# Patient Record
Sex: Male | Born: 1985 | Race: White | Hispanic: No | Marital: Single | Smoking: Never smoker
Health system: Southern US, Community
[De-identification: ages and names within clinical notes are randomized; demographics above are authoritative.]

---

## 2021-04-07 ENCOUNTER — Encounter: Payer: Self-pay | Admitting: Intensive Care

## 2021-04-07 ENCOUNTER — Inpatient Hospital Stay
Admission: EM | Admit: 2021-04-07 | Discharge: 2021-04-10 | DRG: 329 | Disposition: A | Discharge status: Home / Self Care | Payer: Self-pay | Attending: General Surgery | Admitting: General Surgery

## 2021-04-07 ENCOUNTER — Inpatient Hospital Stay: Payer: Self-pay | Admitting: Registered Nurse

## 2021-04-07 ENCOUNTER — Encounter
Admission: EM | Disposition: A | Discharge status: Home / Self Care | Payer: Self-pay | Source: Home / Self Care | Attending: General Surgery

## 2021-04-07 ENCOUNTER — Emergency Department: Payer: Self-pay

## 2021-04-07 ENCOUNTER — Other Ambulatory Visit: Payer: Self-pay

## 2021-04-07 DIAGNOSIS — K450 Other specified abdominal hernia with obstruction, without gangrene: Secondary | ICD-10-CM | POA: Diagnosis present

## 2021-04-07 DIAGNOSIS — K660 Peritoneal adhesions (postprocedural) (postinfection): Secondary | ICD-10-CM | POA: Diagnosis present

## 2021-04-07 DIAGNOSIS — K650 Generalized (acute) peritonitis: Secondary | ICD-10-CM | POA: Diagnosis present

## 2021-04-07 DIAGNOSIS — K55019 Acute (reversible) ischemia of small intestine, extent unspecified: Principal | ICD-10-CM | POA: Diagnosis present

## 2021-04-07 DIAGNOSIS — R1013 Epigastric pain: Secondary | ICD-10-CM

## 2021-04-07 DIAGNOSIS — K56609 Unspecified intestinal obstruction, unspecified as to partial versus complete obstruction: Secondary | ICD-10-CM

## 2021-04-07 DIAGNOSIS — K559 Vascular disorder of intestine, unspecified: Secondary | ICD-10-CM | POA: Diagnosis present

## 2021-04-07 DIAGNOSIS — Z20822 Contact with and (suspected) exposure to covid-19: Secondary | ICD-10-CM | POA: Diagnosis present

## 2021-04-07 HISTORY — PX: LAPAROTOMY: SHX154

## 2021-04-07 HISTORY — PX: BOWEL RESECTION: SHX1257

## 2021-04-07 LAB — COMPREHENSIVE METABOLIC PANEL
ALT: 25 U/L (ref 0–44)
AST: 33 U/L (ref 15–41)
Albumin: 5.8 g/dL — ABNORMAL HIGH (ref 3.5–5.0)
Alkaline Phosphatase: 54 U/L (ref 38–126)
Anion gap: 12 (ref 5–15)
BUN: 18 mg/dL (ref 6–20)
CO2: 20 mmol/L — ABNORMAL LOW (ref 22–32)
Calcium: 10.1 mg/dL (ref 8.9–10.3)
Chloride: 103 mmol/L (ref 98–111)
Creatinine, Ser: 1.01 mg/dL (ref 0.61–1.24)
GFR, Estimated: >60 mL/min (ref >60)
Glucose, Bld: 168 mg/dL — ABNORMAL HIGH (ref 70–99)
Potassium: 4.5 mmol/L (ref 3.5–5.1)
Sodium: 135 mmol/L (ref 135–145)
Total Bilirubin: 1 mg/dL (ref 0.3–1.2)
Total Protein: 8.7 g/dL — ABNORMAL HIGH (ref 6.5–8.1)

## 2021-04-07 LAB — CBC WITH DIFFERENTIAL/PLATELET
Abs Immature Granulocytes: 0.25 10*3/uL — ABNORMAL HIGH (ref 0.00–0.07)
Basophils Absolute: 0.1 10*3/uL (ref 0.0–0.1)
Basophils Relative: 0 %
Eosinophils Absolute: 0 10*3/uL (ref 0.0–0.5)
Eosinophils Relative: 0 %
HCT: 47.6 % (ref 39.0–52.0)
Hemoglobin: 17.4 g/dL — ABNORMAL HIGH (ref 13.0–17.0)
Immature Granulocytes: 1 %
Lymphocytes Relative: 4 %
Lymphs Abs: 1.3 10*3/uL (ref 0.7–4.0)
MCH: 31.8 pg (ref 26.0–34.0)
MCHC: 36.6 g/dL — ABNORMAL HIGH (ref 30.0–36.0)
MCV: 87 fL (ref 80.0–100.0)
Monocytes Absolute: 0.9 10*3/uL (ref 0.1–1.0)
Monocytes Relative: 3 %
Neutro Abs: 26.8 10*3/uL — ABNORMAL HIGH (ref 1.7–7.7)
Neutrophils Relative %: 92 %
Platelets: 288 10*3/uL (ref 150–400)
RBC: 5.47 MIL/uL (ref 4.22–5.81)
RDW: 11.9 % (ref 11.5–15.5)
WBC: 29.3 10*3/uL — ABNORMAL HIGH (ref 4.0–10.5)
nRBC: 0 % (ref 0.0–0.2)

## 2021-04-07 LAB — LIPASE, BLOOD: Lipase: 29 U/L (ref 11–51)

## 2021-04-07 LAB — RESP PANEL BY RT-PCR (FLU A&B, COVID) ARPGX2
Influenza A by PCR: NEGATIVE
Influenza B by PCR: NEGATIVE
SARS Coronavirus 2 by RT PCR: NEGATIVE

## 2021-04-07 LAB — TROPONIN I (HIGH SENSITIVITY): Troponin I (High Sensitivity): <2 ng/L (ref <18)

## 2021-04-07 LAB — ABO/RH: ABO/RH(D): A POS

## 2021-04-07 LAB — CBC
HCT: 47.5 % (ref 39.0–52.0)
Hemoglobin: 17.5 g/dL — ABNORMAL HIGH (ref 13.0–17.0)
MCH: 32.1 pg (ref 26.0–34.0)
MCHC: 36.8 g/dL — ABNORMAL HIGH (ref 30.0–36.0)
MCV: 87 fL (ref 80.0–100.0)
Platelets: 283 10*3/uL (ref 150–400)
RBC: 5.46 MIL/uL (ref 4.22–5.81)
RDW: 11.9 % (ref 11.5–15.5)
WBC: 29.4 10*3/uL — ABNORMAL HIGH (ref 4.0–10.5)
nRBC: 0 % (ref 0.0–0.2)

## 2021-04-07 LAB — TYPE AND SCREEN
ABO/RH(D): A POS
Antibody Screen: NEGATIVE

## 2021-04-07 LAB — LACTIC ACID, PLASMA: Lactic Acid, Venous: 2.9 mmol/L (ref 0.5–1.9)

## 2021-04-07 SURGERY — LAPAROTOMY, EXPLORATORY
Anesthesia: General

## 2021-04-07 MED ORDER — PROMETHAZINE HCL 25 MG/ML IJ SOLN: 6.2500 mg | INTRAMUSCULAR | Status: DC | PRN

## 2021-04-07 MED ORDER — HYDROCODONE-ACETAMINOPHEN 5-325 MG PO TABS
1.0000 | ORAL_TABLET | ORAL | Status: DC | PRN
  Administered 2021-04-07 – 2021-04-10 (×4): 2 via ORAL
  Filled 2021-04-07 (×5): qty 2

## 2021-04-07 MED ORDER — DEXMEDETOMIDINE HCL IN NACL 200 MCG/50ML IV SOLN
INTRAVENOUS | Status: DC | PRN
  Administered 2021-04-07: 12 ug via INTRAVENOUS

## 2021-04-07 MED ORDER — FENTANYL CITRATE (PF) 100 MCG/2ML IJ SOLN
INTRAMUSCULAR | Status: AC
  Filled 2021-04-07: qty 2

## 2021-04-07 MED ORDER — ROCURONIUM BROMIDE 100 MG/10ML IV SOLN
INTRAVENOUS | Status: DC | PRN
  Administered 2021-04-07: 10 mg via INTRAVENOUS
  Administered 2021-04-07 (×2): 20 mg via INTRAVENOUS
  Administered 2021-04-07: 50 mg via INTRAVENOUS

## 2021-04-07 MED ORDER — SODIUM CHLORIDE 0.9 % IV SOLN: INTRAVENOUS | Status: DC

## 2021-04-07 MED ORDER — MIDAZOLAM HCL 2 MG/2ML IJ SOLN
INTRAMUSCULAR | Status: DC | PRN
  Administered 2021-04-07: 2 mg via INTRAVENOUS

## 2021-04-07 MED ORDER — DEXAMETHASONE SODIUM PHOSPHATE 10 MG/ML IJ SOLN
INTRAMUSCULAR | Status: AC
  Filled 2021-04-07: qty 1

## 2021-04-07 MED ORDER — PROPOFOL 10 MG/ML IV BOLUS
INTRAVENOUS | Status: AC
  Filled 2021-04-07: qty 20

## 2021-04-07 MED ORDER — ENOXAPARIN SODIUM 40 MG/0.4ML IJ SOSY
40.0000 mg | PREFILLED_SYRINGE | INTRAMUSCULAR | Status: DC
  Administered 2021-04-07 – 2021-04-09 (×3): 40 mg via SUBCUTANEOUS
  Filled 2021-04-07 (×3): qty 0.4

## 2021-04-07 MED ORDER — ONDANSETRON HCL 4 MG/2ML IJ SOLN
INTRAMUSCULAR | Status: AC
  Filled 2021-04-07: qty 2

## 2021-04-07 MED ORDER — ONDANSETRON HCL 4 MG/2ML IJ SOLN: 4.0000 mg | Freq: Four times a day (QID) | INTRAMUSCULAR | Status: DC | PRN

## 2021-04-07 MED ORDER — LACTATED RINGERS IV BOLUS
1000.0000 mL | Freq: Once | INTRAVENOUS | Status: AC
  Administered 2021-04-07: 1000 mL via INTRAVENOUS

## 2021-04-07 MED ORDER — BUPIVACAINE LIPOSOME 1.3 % IJ SUSP
INTRAMUSCULAR | Status: DC | PRN
  Administered 2021-04-07: 20 mL

## 2021-04-07 MED ORDER — LACTATED RINGERS IV SOLN: INTRAVENOUS | Status: DC | PRN

## 2021-04-07 MED ORDER — MIDAZOLAM HCL 2 MG/2ML IJ SOLN
INTRAMUSCULAR | Status: AC
  Filled 2021-04-07: qty 2

## 2021-04-07 MED ORDER — FENTANYL CITRATE (PF) 100 MCG/2ML IJ SOLN
50.0000 ug | INTRAMUSCULAR | Status: AC | PRN
  Administered 2021-04-07 (×2): 50 ug via INTRAVENOUS
  Filled 2021-04-07 (×2): qty 2

## 2021-04-07 MED ORDER — HYDROMORPHONE HCL 1 MG/ML IJ SOLN
0.5000 mg | INTRAMUSCULAR | Status: DC | PRN
  Administered 2021-04-07: 0.5 mg via INTRAVENOUS
  Filled 2021-04-07: qty 1

## 2021-04-07 MED ORDER — PIPERACILLIN-TAZOBACTAM 3.375 G IVPB 30 MIN: 3.3750 g | Freq: Once | INTRAVENOUS | Status: DC

## 2021-04-07 MED ORDER — PIPERACILLIN-TAZOBACTAM 3.375 G IVPB
INTRAVENOUS | Status: AC
  Filled 2021-04-07: qty 50

## 2021-04-07 MED ORDER — LIDOCAINE HCL (PF) 2 % IJ SOLN
INTRAMUSCULAR | Status: AC
  Filled 2021-04-07: qty 5

## 2021-04-07 MED ORDER — DEXAMETHASONE SODIUM PHOSPHATE 10 MG/ML IJ SOLN
INTRAMUSCULAR | Status: DC | PRN
  Administered 2021-04-07: 10 mg via INTRAVENOUS

## 2021-04-07 MED ORDER — PANTOPRAZOLE SODIUM 40 MG IV SOLR
40.0000 mg | Freq: Every day | INTRAVENOUS | Status: DC
  Administered 2021-04-07 – 2021-04-09 (×3): 40 mg via INTRAVENOUS
  Filled 2021-04-07 (×3): qty 40

## 2021-04-07 MED ORDER — ONDANSETRON HCL 4 MG/2ML IJ SOLN
4.0000 mg | Freq: Once | INTRAMUSCULAR | Status: AC | PRN
  Administered 2021-04-07: 4 mg via INTRAVENOUS
  Filled 2021-04-07: qty 2

## 2021-04-07 MED ORDER — PIPERACILLIN-TAZOBACTAM 3.375 G IVPB
3.3750 g | Freq: Three times a day (TID) | INTRAVENOUS | Status: DC
  Administered 2021-04-07 – 2021-04-10 (×8): 3.375 g via INTRAVENOUS
  Filled 2021-04-07 (×8): qty 50

## 2021-04-07 MED ORDER — ONDANSETRON 4 MG PO TBDP: 4.0000 mg | ORAL_TABLET | Freq: Four times a day (QID) | ORAL | Status: DC | PRN

## 2021-04-07 MED ORDER — DEXMEDETOMIDINE HCL IN NACL 200 MCG/50ML IV SOLN
INTRAVENOUS | Status: AC
  Filled 2021-04-07: qty 50

## 2021-04-07 MED ORDER — ROCURONIUM BROMIDE 10 MG/ML (PF) SYRINGE
PREFILLED_SYRINGE | INTRAVENOUS | Status: AC
  Filled 2021-04-07: qty 10

## 2021-04-07 MED ORDER — KETAMINE HCL 50 MG/ML IJ SOLN
INTRAMUSCULAR | Status: DC | PRN
  Administered 2021-04-07: 50 mg via INTRAMUSCULAR

## 2021-04-07 MED ORDER — FENTANYL CITRATE (PF) 100 MCG/2ML IJ SOLN
25.0000 ug | INTRAMUSCULAR | Status: DC | PRN
  Administered 2021-04-07: 50 ug via INTRAVENOUS
  Administered 2021-04-07 (×2): 25 ug via INTRAVENOUS

## 2021-04-07 MED ORDER — IOHEXOL 350 MG/ML SOLN
100.0000 mL | Freq: Once | INTRAVENOUS | Status: AC | PRN
  Administered 2021-04-07: 100 mL via INTRAVENOUS

## 2021-04-07 MED ORDER — SUGAMMADEX SODIUM 200 MG/2ML IV SOLN
INTRAVENOUS | Status: DC | PRN
  Administered 2021-04-07: 200 mg via INTRAVENOUS

## 2021-04-07 MED ORDER — PROPOFOL 10 MG/ML IV BOLUS
INTRAVENOUS | Status: DC | PRN
  Administered 2021-04-07: 130 mg via INTRAVENOUS

## 2021-04-07 MED ORDER — EPHEDRINE 5 MG/ML INJ
INTRAVENOUS | Status: AC
  Filled 2021-04-07: qty 5

## 2021-04-07 MED ORDER — PHENYLEPHRINE HCL (PRESSORS) 10 MG/ML IV SOLN
INTRAVENOUS | Status: DC | PRN
  Administered 2021-04-07 (×2): 100 ug via INTRAVENOUS

## 2021-04-07 MED ORDER — HYDROMORPHONE HCL 1 MG/ML IJ SOLN
1.0000 mg | INTRAMUSCULAR | Status: DC | PRN
  Administered 2021-04-07: 1 mg via INTRAVENOUS
  Filled 2021-04-07: qty 1

## 2021-04-07 MED ORDER — FENTANYL CITRATE (PF) 100 MCG/2ML IJ SOLN
INTRAMUSCULAR | Status: DC | PRN
  Administered 2021-04-07: 50 ug via INTRAVENOUS
  Administered 2021-04-07: 100 ug via INTRAVENOUS

## 2021-04-07 MED ORDER — BUPIVACAINE-EPINEPHRINE (PF) 0.5% -1:200000 IJ SOLN
INTRAMUSCULAR | Status: DC | PRN
  Administered 2021-04-07: 30 mL via PERINEURAL

## 2021-04-07 MED ORDER — ACETAMINOPHEN 650 MG RE SUPP: 650.0000 mg | Freq: Four times a day (QID) | RECTAL | Status: DC | PRN

## 2021-04-07 MED ORDER — HYDROMORPHONE HCL 1 MG/ML IJ SOLN
INTRAMUSCULAR | Status: AC
  Filled 2021-04-07: qty 1

## 2021-04-07 MED ORDER — ACETAMINOPHEN 10 MG/ML IV SOLN
INTRAVENOUS | Status: AC
  Filled 2021-04-07: qty 100

## 2021-04-07 MED ORDER — MORPHINE SULFATE (PF) 4 MG/ML IV SOLN
4.0000 mg | INTRAVENOUS | Status: DC | PRN
Start: 2021-04-07 — End: 2021-04-10
  Administered 2021-04-07 – 2021-04-08 (×5): 4 mg via INTRAVENOUS
  Filled 2021-04-07 (×5): qty 1

## 2021-04-07 MED ORDER — HYDROMORPHONE HCL 1 MG/ML IJ SOLN
0.5000 mg | INTRAMUSCULAR | Status: AC | PRN
  Administered 2021-04-07 (×4): 0.5 mg via INTRAVENOUS

## 2021-04-07 MED ORDER — CEFAZOLIN SODIUM-DEXTROSE 2-3 GM-%(50ML) IV SOLR
INTRAVENOUS | Status: DC | PRN
  Administered 2021-04-07: 2 g via INTRAVENOUS

## 2021-04-07 MED ORDER — ONDANSETRON HCL 4 MG/2ML IJ SOLN
INTRAMUSCULAR | Status: DC | PRN
  Administered 2021-04-07: 4 mg via INTRAVENOUS

## 2021-04-07 MED ORDER — ACETAMINOPHEN 325 MG PO TABS: 650.0000 mg | ORAL_TABLET | Freq: Four times a day (QID) | ORAL | Status: DC | PRN

## 2021-04-07 MED ORDER — ACETAMINOPHEN 10 MG/ML IV SOLN
INTRAVENOUS | Status: DC | PRN
  Administered 2021-04-07: 1000 mg via INTRAVENOUS

## 2021-04-07 MED ORDER — 0.9 % SODIUM CHLORIDE (POUR BTL) OPTIME
TOPICAL | Status: DC | PRN
  Administered 2021-04-07: 3500 mL

## 2021-04-07 MED ORDER — KETOROLAC TROMETHAMINE 30 MG/ML IJ SOLN
INTRAMUSCULAR | Status: AC
  Filled 2021-04-07: qty 1

## 2021-04-07 SURGICAL SUPPLY — 38 items
CANISTER SUCT 1200ML W/VALVE (MISCELLANEOUS) ×3 IMPLANT
CHLORAPREP W/TINT 26 (MISCELLANEOUS) ×3 IMPLANT
DRAPE LAPAROTOMY 100X77 ABD (DRAPES) ×3 IMPLANT
DRSG OPSITE POSTOP 4X10 (GAUZE/BANDAGES/DRESSINGS) ×3 IMPLANT
DRSG OPSITE POSTOP 4X8 (GAUZE/BANDAGES/DRESSINGS) IMPLANT
DRSG TEGADERM 4X10 (GAUZE/BANDAGES/DRESSINGS) IMPLANT
DRSG TELFA 3X8 NADH (GAUZE/BANDAGES/DRESSINGS) IMPLANT
ELECT BLADE 6.5 EXT (BLADE) ×3 IMPLANT
ELECT CAUTERY BLADE 6.4 (BLADE) ×3 IMPLANT
ELECT REM PT RETURN 9FT ADLT (ELECTROSURGICAL) ×3
ELECTRODE REM PT RTRN 9FT ADLT (ELECTROSURGICAL) ×2 IMPLANT
GAUZE 4X4 16PLY ~~LOC~~+RFID DBL (SPONGE) ×3 IMPLANT
GLOVE SURG ENC MOIS LTX SZ6.5 (GLOVE) ×9 IMPLANT
GLOVE SURG UNDER POLY LF SZ6.5 (GLOVE) ×9 IMPLANT
GOWN STRL REUS W/ TWL LRG LVL3 (GOWN DISPOSABLE) ×6 IMPLANT
GOWN STRL REUS W/TWL LRG LVL3 (GOWN DISPOSABLE) ×3
HANDLE SUCTION POOLE (INSTRUMENTS) ×2 IMPLANT
KIT TURNOVER KIT A (KITS) ×3 IMPLANT
LABEL OR SOLS (LABEL) ×3 IMPLANT
LIGASURE IMPACT 36 18CM CVD LR (INSTRUMENTS) ×3 IMPLANT
MANIFOLD NEPTUNE II (INSTRUMENTS) ×3 IMPLANT
NS IRRIG 1000ML POUR BTL (IV SOLUTION) ×15 IMPLANT
PACK BASIN MAJOR ARMC (MISCELLANEOUS) ×3 IMPLANT
PACK COLON CLEAN CLOSURE (MISCELLANEOUS) IMPLANT
RELOAD LINEAR CUT PROX 55 BLUE (ENDOMECHANICALS) ×9 IMPLANT
SPONGE T-LAP 18X18 ~~LOC~~+RFID (SPONGE) ×9 IMPLANT
STAPLER PROXIMATE 55 BLUE (STAPLE) ×3 IMPLANT
STAPLER SKIN PROX 35W (STAPLE) ×3 IMPLANT
SUCTION POOLE HANDLE (INSTRUMENTS) ×3
SUT PDS AB 0 CT1 27 (SUTURE) ×6 IMPLANT
SUT PDS AB 1 TP1 54 (SUTURE) ×6 IMPLANT
SUT SILK 2 0 (SUTURE) ×1
SUT SILK 2-0 18XBRD TIE 12 (SUTURE) ×2 IMPLANT
SUT SILK 3 0 (SUTURE) ×1
SUT SILK 3-0 18XBRD TIE 12 (SUTURE) ×2 IMPLANT
SUT VIC AB 3-0 SH 27 (SUTURE) ×2
SUT VIC AB 3-0 SH 27X BRD (SUTURE) ×4 IMPLANT
TRAY FOLEY MTR SLVR 16FR STAT (SET/KITS/TRAYS/PACK) ×3 IMPLANT

## 2021-04-07 NOTE — H&P (Signed)
SURGICAL HISTORY AND PHYSICAL NOTE   HISTORY OF PRESENT ILLNESS (HPI):  35 y.o. male presented to [facility] ED for evaluation of abdominal pain since this morning. Patient reports he was feeling peripherally fine yesterday but this morning he started with severe abdominal pain.  Pain localized to the left abdomen.  He report associated nausea.  Pain aggravated by applying pressure.  He has been no alleviating factors.  At the ED he was found with severe leukocytosis.  He was also found with elevated lactic acid.  CT scan of the abdomen and pelvis shows small bowel close loop obstruction with suspected strangulation.  He denies any previous abdominal surgery.  There is no free air.  I personally evaluated the images  Surgery is consulted by Dr. Roxan Hockey in this context for evaluation and management of closed-loop small bowel obstruction.  PAST MEDICAL HISTORY (PMH):  History reviewed. No pertinent past medical history.   PAST SURGICAL HISTORY (PSH):  History reviewed. No pertinent surgical history.   MEDICATIONS:  Prior to Admission medications   Not on File     ALLERGIES:  Allergies  Allergen Reactions   Morphine And Related      SOCIAL HISTORY:  Social History   Socioeconomic History   Marital status: Single    Spouse name: Not on file   Number of children: Not on file   Years of education: Not on file   Highest education level: Not on file  Occupational History   Not on file  Tobacco Use   Smoking status: Never   Smokeless tobacco: Never  Substance and Sexual Activity   Alcohol use: Never   Drug use: Never   Sexual activity: Not on file  Other Topics Concern   Not on file  Social History Narrative   Not on file   Social Determinants of Health   Financial Resource Strain: Not on file  Food Insecurity: Not on file  Transportation Needs: Not on file  Physical Activity: Not on file  Stress: Not on file  Social Connections: Not on file  Intimate Partner Violence:  Not on file      FAMILY HISTORY:  History reviewed. No pertinent family history.   REVIEW OF SYSTEMS:  Constitutional: denies weight loss, fever, chills, or sweats  Eyes: denies any other vision changes, history of eye injury  ENT: denies sore throat, hearing problems  Respiratory: denies shortness of breath, wheezing  Cardiovascular: denies chest pain, palpitations  Gastrointestinal: Positive abdominal pain, nausea and vomiting Genitourinary: denies burning with urination or urinary frequency Musculoskeletal: denies any other joint pains or cramps  Skin: denies any other rashes or skin discolorations  Neurological: denies any other headache, dizziness, weakness  Psychiatric: denies any other depression, anxiety   All other review of systems were negative   VITAL SIGNS:  Temp:  [97.7 F (36.5 C)] 97.7 F (36.5 C) (08/05 0939) Pulse Rate:  [61-81] 70 (08/05 1227) Resp:  [22-24] 22 (08/05 1227) BP: (135-219)/(78-104) 135/78 (08/05 1227) SpO2:  [85 %-100 %] 100 % (08/05 1227) Weight:  [97.5 kg] 97.5 kg (08/05 0943)     Height: 5\' 9"  (175.3 cm) Weight: 97.5 kg BMI (Calculated): 31.74   INTAKE/OUTPUT:  This shift: Total I/O In: 1000 [IV Piggyback:1000] Out: -   Last 2 shifts: @IOLAST2SHIFTS @   PHYSICAL EXAM:  Constitutional:  -- Normal body habitus  -- Awake, alert, and oriented x3  Eyes:  -- Pupils equally round and reactive to light  -- No scleral icterus  Ear,  nose, and throat:  -- No jugular venous distension  Pulmonary:  -- No crackles  -- Equal breath sounds bilaterally -- Breathing non-labored at rest Cardiovascular:  -- S1, S2 present  -- No pericardial rubs Gastrointestinal:  -- Abdomen soft, tender in the left upper and lower abdomen, non-distended, positive guarding and rebound tenderness -- No abdominal masses appreciated, pulsatile or otherwise  Musculoskeletal and Integumentary:  -- Wounds: None appreciated -- Extremities: B/L UE and LE FROM, hands  and feet warm, no edema  Neurologic:  -- Motor function: intact and symmetric -- Sensation: intact and symmetric   Labs:  CBC Latest Ref Rng & Units 04/07/2021 04/07/2021  WBC 4.0 - 10.5 K/uL 29.3(H) 29.4(H)  Hemoglobin 13.0 - 17.0 g/dL 17.4(H) 17.5(H)  Hematocrit 39.0 - 52.0 % 47.6 47.5  Platelets 150 - 400 K/uL 288 283   CMP Latest Ref Rng & Units 04/07/2021  Glucose 70 - 99 mg/dL 341(P)  BUN 6 - 20 mg/dL 18  Creatinine 3.79 - 0.24 mg/dL 0.97  Sodium 353 - 299 mmol/L 135  Potassium 3.5 - 5.1 mmol/L 4.5  Chloride 98 - 111 mmol/L 103  CO2 22 - 32 mmol/L 20(L)  Calcium 8.9 - 10.3 mg/dL 24.2  Total Protein 6.5 - 8.1 g/dL 6.8(T)  Total Bilirubin 0.3 - 1.2 mg/dL 1.0  Alkaline Phos 38 - 126 U/L 54  AST 15 - 41 U/L 33  ALT 0 - 44 U/L 25     Imaging studies:  EXAM: CT ABDOMEN AND PELVIS WITH CONTRAST   TECHNIQUE: Multidetector CT imaging of the abdomen and pelvis was performed using the standard protocol following bolus administration of intravenous contrast.   CONTRAST:  OMNIPAQUE IOHEXOL 350 MG/ML SOLN   COMPARISON:  None.   FINDINGS: Lower chest: See report for CTA Chest performed at the same time.   Hepatobiliary: No suspicious focal abnormality within the liver parenchyma. There is no evidence for gallstones, gallbladder wall thickening, or pericholecystic fluid. No intrahepatic or extrahepatic biliary dilation.   Pancreas: No focal mass lesion. No dilatation of the main duct. No intraparenchymal cyst. No peripancreatic edema.   Spleen: No splenomegaly. No focal mass lesion.   Adrenals/Urinary Tract: No adrenal nodule or mass. Right kidney unremarkable. 11 mm low-density lesion lower pole left kidney has features compatible with a cyst. No evidence for hydroureter. The urinary bladder appears normal for the degree of distention.   Stomach/Bowel: Stomach is nondistended Duodenum is normally positioned as is the ligament of Treitz. Jejunal loops  are nondistended. Central small bowel loops of the mid left abdomen are fluid-filled and dilated up to 3.6 cm diameter. No substantial wall thickening or evidence of pneumatosis. There is fairly marked edema and congestion of the subtending mesentery   2 discrete areas of transition are identified, 1 is in the central left paramidline abdomen visible on axial image 43/2 and coronal 36/5. A second transition zone is identified in close proximity, just posterior and slightly more to the left, also visible on axial 43/2 and visible on coronal 40/5. Distal and terminal ileal loops are decompressed. Fecalization of distal small bowel contents suggest decreased transit. Appearance superficially raises the question of pneumatosis although this bowel is otherwise normal in appearance and right colon has similar imaging features making the finding most compatible with enteric and colonic intraluminal contents. Stool is visible along the course of the nondistended colon.   Vascular/Lymphatic: No abdominal aortic aneurysm. No abdominal lymphadenopathy.   Reproductive: The prostate gland and seminal vesicles  are unremarkable.   Other: Small volume free liver and spleen fluid noted around the with fluid in both para colic gutters and a moderate amount of fluid noted in the pelvis.   Musculoskeletal: No worrisome lytic or sclerotic osseous abnormality.   IMPRESSION: 1. Dilated fluid-filled small bowel loops in the mid left abdomen with fairly marked edema and congestion of the subtending mesentery consistent with obstruction. The proximal jejunal loops are nondilated and the distal ileal loops are nondilated. Two discrete, adjacent areas of transition are identified. Given the large amount of edema and fluid in the subtending mesentery, small bowel ischemia is a concern although no substantial wall thickening is evident and there are no findings of pneumatosis. The presence of 2  distinct transition zones suggests closed loop obstruction with potential of internal hernia as the etiology. 2. Small volume free liver and spleen fluid with fluid in both para colic gutters and a moderate amount of fluid noted in the pelvis.   I discussed these results by telephone with Dr. Roxan Hockey at approximately 1226 hours on 04/07/2021.     Electronically Signed   By: Kennith Center M.D.   On: 04/07/2021 12:26  Assessment/Plan:  35 y.o. male with closed-loop small bowel obstruction with suspected strangulation.  Patient with physical exam, history and CT scan consistent with closed-loop small bowel obstruction.  There is suspicious of a early strangulation.  I discussed with the patient the need for emergent exploratory laparotomy to relieve the obstruction and possible small bowel resection if there is bowel ischemia.  I also discussed with the patient the possibility of needing multiple surgical interventions.  I discussed with the patient the risk of injuring adjacent organs, anastomosis leak, bleeding, obstruction, intra-abdominal abscess, among others.  Patient reports he understood and agreed to proceed.  Mother was at bedside.  Gae Gallop, MD

## 2021-04-07 NOTE — ED Triage Notes (Signed)
PAtient c/o abdominal pain with N/V. Denies diarrhea. Started 3am today. PAtient appears pale and diaphoretic

## 2021-04-07 NOTE — ED Provider Notes (Signed)
[facility] Emergency Department Provider Note    Event Date/Time   First MD Initiated Contact with Patient 04/07/21 1014     (approximate)  I have reviewed the triage vital signs and the nursing notes.   HISTORY  Chief Complaint Abdominal Pain    HPI Austin Mccullough is a 35 y.o. male below listed past medical history presents to the ER for evaluation of severe epigastric pain associate with nausea and vomiting.  Woke him from sleep last night.  Otherwise was feeling well yesterday.  No fevers no chills.  No diarrhea.  Remote history of hernia repair as a child.  History reviewed. No pertinent past medical history. History reviewed. No pertinent family history. History reviewed. No pertinent surgical history. Patient Active Problem List   Diagnosis Date Noted   Small bowel ischemia (HCC) 04/07/2021       Prior to Admission medications   Not on File    Allergies Morphine and related    Social History Social History   Tobacco Use   Smoking status: Never   Smokeless tobacco: Never  Substance Use Topics   Alcohol use: Never   Drug use: Never    Review of Systems Patient denies headaches, rhinorrhea, blurry vision, numbness, shortness of breath, chest pain, edema, cough, abdominal pain, nausea, vomiting, diarrhea, dysuria, fevers, rashes or hallucinations unless otherwise stated above in HPI. ____________________________________________   PHYSICAL EXAM:  VITAL SIGNS: Vitals:   04/07/21 1227 04/07/21 1322  BP: 135/78 (!) 133/91  Pulse: 70 66  Resp: (!) 22 14  Temp:  98.2 F (36.8 C)  SpO2: 100% 100%    Constitutional: Alert and oriented.  Eyes: Conjunctivae are normal.  Head: Atraumatic. Nose: No congestion/rhinnorhea. Mouth/Throat: Mucous membranes are moist.   Neck: No stridor. Painless ROM.  Cardiovascular: Normal rate, regular rhythm. Grossly normal heart sounds.  Good peripheral circulation. Respiratory: Normal  respiratory effort.  No retractions. Lungs CTAB. Gastrointestinal: Soft with mild epigastric ttp. No distention. No abdominal bruits. No CVA tenderness. Genitourinary:  Musculoskeletal: No lower extremity tenderness nor edema.  No joint effusions. Neurologic:  Normal speech and language. No gross focal neurologic deficits are appreciated. No facial droop Skin:  Skin is warm, dry and intact. No rash noted. Psychiatric: Mood and affect are normal. Speech and behavior are normal.  ____________________________________________   LABS (all labs ordered are listed, but only abnormal results are displayed)  Results for orders placed or performed during the hospital encounter of 04/07/21 (from the past 24 hour(s))  Lipase, blood     Status: None   Collection Time: 04/07/21  9:46 AM  Result Value Ref Range   Lipase 29 11 - 51 U/L  Comprehensive metabolic panel     Status: Abnormal   Collection Time: 04/07/21  9:46 AM  Result Value Ref Range   Sodium 135 135 - 145 mmol/L   Potassium 4.5 3.5 - 5.1 mmol/L   Chloride 103 98 - 111 mmol/L   CO2 20 (L) 22 - 32 mmol/L   Glucose, Bld 168 (H) 70 - 99 mg/dL   BUN 18 6 - 20 mg/dL   Creatinine, Ser 8.31 0.61 - 1.24 mg/dL   Calcium 51.7 8.9 - 61.6 mg/dL   Total Protein 8.7 (H) 6.5 - 8.1 g/dL   Albumin 5.8 (H) 3.5 - 5.0 g/dL   AST 33 15 - 41 U/L   ALT 25 0 - 44 U/L   Alkaline Phosphatase 54 38 - 126 U/L   Total  Bilirubin 1.0 0.3 - 1.2 mg/dL   GFR, Estimated >06 >26 mL/min   Anion gap 12 5 - 15  CBC     Status: Abnormal   Collection Time: 04/07/21  9:46 AM  Result Value Ref Range   WBC 29.4 (H) 4.0 - 10.5 K/uL   RBC 5.46 4.22 - 5.81 MIL/uL   Hemoglobin 17.5 (H) 13.0 - 17.0 g/dL   HCT 94.8 54.6 - 27.0 %   MCV 87.0 80.0 - 100.0 fL   MCH 32.1 26.0 - 34.0 pg   MCHC 36.8 (H) 30.0 - 36.0 g/dL   RDW 35.0 09.3 - 81.8 %   Platelets 283 150 - 400 K/uL   nRBC 0.0 0.0 - 0.2 %  CBC with Differential/Platelet     Status: Abnormal   Collection Time:  04/07/21  9:46 AM  Result Value Ref Range   WBC 29.3 (H) 4.0 - 10.5 K/uL   RBC 5.47 4.22 - 5.81 MIL/uL   Hemoglobin 17.4 (H) 13.0 - 17.0 g/dL   HCT 29.9 37.1 - 69.6 %   MCV 87.0 80.0 - 100.0 fL   MCH 31.8 26.0 - 34.0 pg   MCHC 36.6 (H) 30.0 - 36.0 g/dL   RDW 78.9 38.1 - 01.7 %   Platelets 288 150 - 400 K/uL   nRBC 0.0 0.0 - 0.2 %   Neutrophils Relative % 92 %   Neutro Abs 26.8 (H) 1.7 - 7.7 K/uL   Lymphocytes Relative 4 %   Lymphs Abs 1.3 0.7 - 4.0 K/uL   Monocytes Relative 3 %   Monocytes Absolute 0.9 0.1 - 1.0 K/uL   Eosinophils Relative 0 %   Eosinophils Absolute 0.0 0.0 - 0.5 K/uL   Basophils Relative 0 %   Basophils Absolute 0.1 0.0 - 0.1 K/uL   Immature Granulocytes 1 %   Abs Immature Granulocytes 0.25 (H) 0.00 - 0.07 K/uL  Resp Panel by RT-PCR (Flu A&B, Covid) Nasopharyngeal Swab     Status: None   Collection Time: 04/07/21 10:48 AM   Specimen: Nasopharyngeal Swab; Nasopharyngeal(NP) swabs in vial transport medium  Result Value Ref Range   SARS Coronavirus 2 by RT PCR NEGATIVE NEGATIVE   Influenza A by PCR NEGATIVE NEGATIVE   Influenza B by PCR NEGATIVE NEGATIVE  Lactic acid, plasma     Status: Abnormal   Collection Time: 04/07/21 10:48 AM  Result Value Ref Range   Lactic Acid, Venous 2.9 (HH) 0.5 - 1.9 mmol/L  Troponin I (High Sensitivity)     Status: None   Collection Time: 04/07/21 10:48 AM  Result Value Ref Range   Troponin I (High Sensitivity) <2 <18 ng/L  Type and screen [facility]     Status: None (Preliminary result)   Collection Time: 04/07/21  1:30 PM  Result Value Ref Range   ABO/RH(D) PENDING    Antibody Screen PENDING    Sample Expiration      04/10/2021,2359 Performed at [facility], [address] [place], [place] [address]   ABO/Rh     Status: None (Preliminary result)   Collection Time: 04/07/21  1:35 PM  Result Value Ref Range   ABO/RH(D) PENDING    ____________________________________________  EKG My  review and personal interpretation at Time: 11:05    Indication: abd pain  Rate: 60  Rhythm: sinus Axis: normal Other concave upward st abn, likely BER ____________________________________________  RADIOLOGY  I personally reviewed all radiographic images ordered to evaluate for the above acute complaints  and reviewed radiology reports and findings.  These findings were personally discussed with the patient.  Please see medical record for radiology report.  ____________________________________________   PROCEDURES  Procedure(s) performed:  .Critical Care  Date/Time: 04/07/2021 12:37 PM Performed by: Willy Eddy, MD Authorized by: Willy Eddy, MD   Critical care provider statement:    Critical care time (minutes):  35   Critical care time was exclusive of:  Separately billable procedures and treating other patients   Critical care was time spent personally by me on the following activities:  Development of treatment plan with patient or surrogate, discussions with consultants, evaluation of patient's response to treatment, examination of patient, obtaining history from patient or surrogate, ordering and performing treatments and interventions, ordering and review of laboratory studies, ordering and review of radiographic studies, pulse oximetry, re-evaluation of patient's condition and review of old charts    Critical Care performed: yes ____________________________________________   INITIAL IMPRESSION / ASSESSMENT AND PLAN / ED COURSE  Pertinent labs & imaging results that were available during my care of the patient were reviewed by me and considered in my medical decision making (see chart for details).   DDX: Obstruction, perforation, enteritis, colitis, appendicitis, gastritis, biliary pathology, COVID, PE  Mihcael Ledee is a 35 y.o. who presents to the ED with presentation as described above.  Patient uncomfortable appearing hypertensive upon arrival.  Does have  tenderness on exam with epigastric region.  Previously healthy with no significant past medical history blood work sent for above differential.  Given pain medication and complaint of increasing shortness of breath found to be hypoxic requiring 2 L therefore CTA of chest also ordered given current discern for possible pneumonia or PE.  Clinical Course as of 04/07/21 1446  Fri Apr 07, 2021  1145 CT imaging by m y review is concerning for small bowel obstruction. [PR]  1234 CT imaging with radiology confirmed findings suggestive of closed-loop obstruction with edema and congestion concerning for ischemic bowel is consistent with his elevated lactate.  Given IV fluids.  Have consulted with general surgery who evaluated patient at bedside for management.  Patient and family agreeable plan. [PR]    Clinical Course User Index [PR] Willy Eddy, MD    The patient was evaluated in Emergency Department today for the symptoms described in the history of present illness. He/she was evaluated in the context of the global COVID-19 pandemic, which necessitated consideration that the patient might be at risk for infection with the SARS-CoV-2 virus that causes COVID-19. Institutional protocols and algorithms that pertain to the evaluation of patients at risk for COVID-19 are in a state of rapid change based on information released by regulatory bodies including the CDC and federal and state organizations. These policies and algorithms were followed during the patient's care in the ED.  As part of my medical decision making, I reviewed the following data within the electronic MEDICAL RECORD NUMBER Nursing notes reviewed and incorporated, Labs reviewed, notes from prior ED visits and  Controlled Substance Database   ____________________________________________   FINAL CLINICAL IMPRESSION(S) / ED DIAGNOSES  Final diagnoses:  Epigastric pain  Intestinal obstruction, unspecified cause, unspecified whether  partial or complete (HCC)      NEW MEDICATIONS STARTED DURING THIS VISIT:  There are no discharge medications for this patient.    Note:  This document was prepared using Dragon voice recognition software and may include unintentional dictation errors.    Willy Eddy, MD 04/07/21 [phone]

## 2021-04-07 NOTE — Anesthesia Procedure Notes (Signed)
Procedure Name: Intubation Date/Time: 04/07/2021 1:46 PM Performed by: Jonna Clark, CRNA Pre-anesthesia Checklist: Patient identified, Patient being monitored, Timeout performed, Emergency Drugs available and Suction available Patient Re-evaluated:Patient Re-evaluated prior to induction Oxygen Delivery Method: Circle system utilized Preoxygenation: Pre-oxygenation with 100% oxygen Induction Type: IV induction Ventilation: Mask ventilation without difficulty Laryngoscope Size: Mac and 4 Grade View: Grade II Tube type: Oral Tube size: 7.5 mm Number of attempts: 1 Airway Equipment and Method: Stylet Placement Confirmation: ETT inserted through vocal cords under direct vision, positive ETCO2 and breath sounds checked- equal and bilateral Secured at: 21 cm Tube secured with: Tape Dental Injury: Teeth and Oropharynx as per pre-operative assessment

## 2021-04-07 NOTE — Transfer of Care (Signed)
Immediate Anesthesia Transfer of Care Note  Patient: Austin Mccullough  Procedure(s) Performed: EXPLORATORY LAPAROTOMY SMALL BOWEL RESECTION  Patient Location: PACU  Anesthesia Type:General  Level of Consciousness: drowsy and patient cooperative  Airway & Oxygen Therapy: Patient Spontanous Breathing and Patient connected to nasal cannula oxygen  Post-op Assessment: Report given to RN and Post -op Vital signs reviewed and stable  Post vital signs: Reviewed and stable  Last Vitals:  Vitals Value Taken Time  BP 111/69 04/07/21 1544  Temp    Pulse 83 04/07/21 1544  Resp 20 04/07/21 1544  SpO2 100 % 04/07/21 1544  Vitals shown include unvalidated device data.  Last Pain:  Vitals:   04/07/21 1322  TempSrc: Oral  PainSc: 4          Complications: No notable events documented.

## 2021-04-07 NOTE — ED Triage Notes (Signed)
C/O epigastric pain, vomiting since 0300.

## 2021-04-07 NOTE — Anesthesia Preprocedure Evaluation (Signed)
Anesthesia Evaluation  Patient identified by MRN, date of birth, ID band Patient awake    Reviewed: Allergy & Precautions, H&P , NPO status , Patient's Chart, lab work & pertinent test results, reviewed documented beta blocker date and time   History of Anesthesia Complications Negative for: history of anesthetic complications  Airway Mallampati: II  TM Distance: >3 FB Neck ROM: full    Dental  (+) Dental Advidsory Given, Teeth Intact   Pulmonary neg pulmonary ROS,    Pulmonary exam normal breath sounds clear to auscultation       Cardiovascular Exercise Tolerance: Good negative cardio ROS Normal cardiovascular exam Rhythm:regular Rate:Normal     Neuro/Psych negative neurological ROS  negative psych ROS   GI/Hepatic negative GI ROS, Neg liver ROS,   Endo/Other  negative endocrine ROS  Renal/GU negative Renal ROS  negative genitourinary   Musculoskeletal   Abdominal   Peds  Hematology negative hematology ROS (+)   Anesthesia Other Findings History reviewed. No pertinent past medical history.   Reproductive/Obstetrics negative OB ROS                             Anesthesia Physical Anesthesia Plan  ASA: 1  Anesthesia Plan: General   Post-op Pain Management:    Induction: Intravenous, Rapid sequence and Cricoid pressure planned  PONV Risk Score and Plan: 2 and Ondansetron, Dexamethasone, Midazolam, Treatment may vary due to age or medical condition and Promethazine  Airway Management Planned: Oral ETT  Additional Equipment:   Intra-op Plan:   Post-operative Plan: Extubation in OR  Informed Consent: I have reviewed the patients History and Physical, chart, labs and discussed the procedure including the risks, benefits and alternatives for the proposed anesthesia with the patient or authorized representative who has indicated his/her understanding and acceptance.     Dental  Advisory Given  Plan Discussed with: Anesthesiologist, CRNA and Surgeon  Anesthesia Plan Comments:         Anesthesia Quick Evaluation

## 2021-04-07 NOTE — Op Note (Signed)
Preoperative diagnosis: Closed-loop bowel obstruction.   Postoperative diagnosis: Closed-loop bowel obstruction with strangulated small bowel.  Procedure: Exploratory laparotomy                      Repair of internal hernia                      Small bowel resection.   Anesthesia: GETA  Surgeon: Dr. Hazle Quant, MD  Wound Classification: Clean Contaminated  Indications: Patient is a 35 y.o. male with signs and symptoms of abdominal pain since 3 AM.  CT scan shows a closed-loop bowel obstruction with concern of strangulation.  Findings: 1.  Internal hernia created by adhesion from omentum to mesentery 2.  Long segment of jejunum with gross ischemia 3.  Proximal jejunal and the whole ileum was with adequate circulation. 4.  No other pathology is identified  Description of procedure: The patient was placed in the supine position and general endotracheal anesthesia was induced. A timeout was completed verifying correct patient, procedure, site, positioning, and implant(s) and/or special equipment prior to beginning this procedure. Preoperative antibiotics were given. A Foley catheter and nasogastric tube were placed. The abdomen was prepped and draped in the usual sterile fashion. After a timeout was performed, a vertical midline incision was made from xiphoid to just below the umbilicus. This was deepened through the subcutaneous tissues and hemostasis was achieved with electrocautery. The linea alba was identified and incised and the peritoneal cavity entered. The abdomen was explored abundant amount of turbid purulent peritonitis was identified.  This is consistent with intraperitoneal infection.  Upon evaluation of the small bowel there was a segment of the jejunum that was inside internal hernia defect created from an adhesion from the omentum to the mesentery.  The adhesion was relieved and the internal hernia was able to be repaired.  After repair of the internal hernia the small bowel was  covered with 1 with lap pads to delineate the portion of small bowel that was ischemic.  2 obvious ischemic margins of were identified.  Adequate amount of proximal jejunum and the whole ileum was healthy and able to do an anastomosis.  The bowel was divided with a cutting linear stapler at each resection margin and passed off the table as a specimen. The antimesenteric angles of the proximal and distal seg- ments were then approximated with two sutures of 3-0 Vicryl placed approximately 5 cm apart. Enterotomies were made at the antimesenteric borders and the cutting linear stapler inserted and fired. The lumen was inspected for hemostasis. The enterotomies were closed with a single firing of a linear stapler.  The anastomosis was then inspected for patency and integrity. The mesenteric defect was closed with a running 3-0 Vicryl suture. The abdomen was irrigated with 4 L of saline. The remaining small bowel appeared viable.  After the sponge needle and instrument count was correct, the fascia was closed with a running suture of  PDS 0. The skin was closed with skin staples. The patient tolerated the procedure well and was taken to the postanesthesia care unit in stable condition.   Specimen: Small bowel  Complications: None  Estimated Blood Loss: 100 mL

## 2021-04-07 NOTE — Anesthesia Postprocedure Evaluation (Signed)
Anesthesia Post Note  Patient: Austin Mccullough  Procedure(s) Performed: EXPLORATORY LAPAROTOMY SMALL BOWEL RESECTION  Patient location during evaluation: PACU Anesthesia Type: General Level of consciousness: awake and alert Pain management: pain level controlled Vital Signs Assessment: post-procedure vital signs reviewed and stable Respiratory status: spontaneous breathing, nonlabored ventilation, respiratory function stable and patient connected to nasal cannula oxygen Cardiovascular status: blood pressure returned to baseline and stable Postop Assessment: no apparent nausea or vomiting Anesthetic complications: no   No notable events documented.   Last Vitals:  Vitals:   04/07/21 1801 04/07/21 1858  BP: 129/83   Pulse: 80   Resp: 16   Temp: 37.8 C 36.7 C  SpO2: 98%     Last Pain:  Vitals:   04/07/21 1858  TempSrc: Oral  PainSc:                  [name]

## 2021-04-08 ENCOUNTER — Encounter: Payer: Self-pay | Admitting: General Surgery

## 2021-04-08 LAB — CBC
HCT: 39.7 % (ref 39.0–52.0)
Hemoglobin: 13.8 g/dL (ref 13.0–17.0)
MCH: 31.9 pg (ref 26.0–34.0)
MCHC: 34.8 g/dL (ref 30.0–36.0)
MCV: 91.7 fL (ref 80.0–100.0)
Platelets: 196 10*3/uL (ref 150–400)
RBC: 4.33 MIL/uL (ref 4.22–5.81)
RDW: 12.4 % (ref 11.5–15.5)
WBC: 18.8 10*3/uL — ABNORMAL HIGH (ref 4.0–10.5)
nRBC: 0 % (ref 0.0–0.2)

## 2021-04-08 LAB — BASIC METABOLIC PANEL
Anion gap: 6 (ref 5–15)
BUN: 15 mg/dL (ref 6–20)
CO2: 25 mmol/L (ref 22–32)
Calcium: 8.6 mg/dL — ABNORMAL LOW (ref 8.9–10.3)
Chloride: 104 mmol/L (ref 98–111)
Creatinine, Ser: 0.9 mg/dL (ref 0.61–1.24)
GFR, Estimated: >60 mL/min (ref >60)
Glucose, Bld: 128 mg/dL — ABNORMAL HIGH (ref 70–99)
Potassium: 4.2 mmol/L (ref 3.5–5.1)
Sodium: 135 mmol/L (ref 135–145)

## 2021-04-08 LAB — PHOSPHORUS: Phosphorus: 4.4 mg/dL (ref 2.5–4.6)

## 2021-04-08 LAB — MAGNESIUM: Magnesium: 1.6 mg/dL — ABNORMAL LOW (ref 1.7–2.4)

## 2021-04-08 MED ORDER — KETOROLAC TROMETHAMINE 30 MG/ML IJ SOLN
30.0000 mg | Freq: Three times a day (TID) | INTRAMUSCULAR | Status: DC
  Administered 2021-04-08 – 2021-04-10 (×6): 30 mg via INTRAVENOUS
  Filled 2021-04-08 (×6): qty 1

## 2021-04-08 NOTE — Progress Notes (Signed)
Patient ID: Austin Mccullough, male   DOB: December 30, 1985, 35 y.o.   MRN: [id number]     SURGICAL PROGRESS NOTE   Hospital Day(s): 1.   Interval History: Patient seen and examined, no acute events or new complaints overnight. Patient reports feeling well.  He has some soreness on the surgical area but tolerable.  He denies severe pain.  He is tolerating the clear liquids.  He still not passing gas.  Vital signs in last 24 hours: [min-max] current  Temp:  [97.4 F (36.3 C)-100 F (37.8 C)] 97.5 F (36.4 C) (08/06 0811) Pulse Rate:  [57-92] 57 (08/06 0811) Resp:  [13-22] 16 (08/06 0811) BP: (99-135)/(65-91) 118/81 (08/06 0811) SpO2:  [97 %-100 %] 100 % (08/06 0811)     Height: 5\' 9"  (175.3 cm) Weight: 97.5 kg BMI (Calculated): 31.74   Physical Exam:  Constitutional: alert, cooperative and no distress  Respiratory: breathing non-labored at rest  Cardiovascular: regular rate and sinus rhythm  Gastrointestinal: soft, non-tender, and non-distended.  Wound is dry and clean  Labs:  CBC Latest Ref Rng & Units 04/08/2021 04/07/2021 04/07/2021  WBC 4.0 - 10.5 K/uL 18.8(H) 29.3(H) 29.4(H)  Hemoglobin 13.0 - 17.0 g/dL 06/07/2021 17.4(H) 17.5(H)  Hematocrit 39.0 - 52.0 % 39.7 47.6 47.5  Platelets 150 - 400 K/uL 196 288 283   CMP Latest Ref Rng & Units 04/08/2021 04/07/2021  Glucose 70 - 99 mg/dL 06/07/2021) 160(V)  BUN 6 - 20 mg/dL 15 18  Creatinine 371(G - 1.24 mg/dL 6.26 9.48  Sodium 5.46 - 145 mmol/L 135 135  Potassium 3.5 - 5.1 mmol/L 4.2 4.5  Chloride 98 - 111 mmol/L 104 103  CO2 22 - 32 mmol/L 25 20(L)  Calcium 8.9 - 10.3 mg/dL 270) 3.5(K  Total Protein 6.5 - 8.1 g/dL - 8.7(H)  Total Bilirubin 0.3 - 1.2 mg/dL - 1.0  Alkaline Phos 38 - 126 U/L - 54  AST 15 - 41 U/L - 33  ALT 0 - 44 U/L - 25    Imaging studies: No new pertinent imaging studies   Assessment/Plan:  35 y.o. male with strangulated internal hernia with closed-loop obstruction 1 Day Post-Op s/p repair of internal hernia and small bowel resection  with anastomosis.  Patient without any clinical deterioration.  Recovering slowly but adequately.  Tolerating clear liquids.  Significant improvement in white blood cell count.  Patient with adequate urine output.  No postsurgical issues at this moment.  We will continue with postoperative care.  We will not advance diet until we have better GI functionality we will add Toradol to decrease the use of morphine as needed.  20, MD

## 2021-04-09 NOTE — Progress Notes (Signed)
Patient ID: Austin Mccullough, male   DOB: 02-Oct-1985, 35 y.o.   MRN: [id number]     SURGICAL PROGRESS NOTE   Hospital Day(s): 2.   Interval History: Patient seen and examined, no acute events or new complaints overnight. Patient reports passing lots of gas.  Reports pain is controlled.  He has been able to ambulate.  He did does not have any nausea with clear liquids.  Vital signs in last 24 hours: [min-max] current  Temp:  [98.2 F (36.8 C)-98.6 F (37 C)] 98.5 F (36.9 C) (08/07 0822) Pulse Rate:  [61-67] 67 (08/07 0822) Resp:  [16-17] 16 (08/07 0822) BP: (107-134)/(71-77) 114/77 (08/07 0822) SpO2:  [98 %-99 %] 98 % (08/07 0822)     Height: 5\' 9"  (175.3 cm) Weight: 97.5 kg BMI (Calculated): 31.74   Physical Exam:  Constitutional: alert, cooperative and no distress  Respiratory: breathing non-labored at rest  Cardiovascular: regular rate and sinus rhythm  Gastrointestinal: soft, non-tender, and non-distended.  Wound is dry and clean  Labs:  CBC Latest Ref Rng & Units 04/08/2021 04/07/2021 04/07/2021  WBC 4.0 - 10.5 K/uL 18.8(H) 29.3(H) 29.4(H)  Hemoglobin 13.0 - 17.0 g/dL 06/07/2021 17.4(H) 17.5(H)  Hematocrit 39.0 - 52.0 % 39.7 47.6 47.5  Platelets 150 - 400 K/uL 196 288 283   CMP Latest Ref Rng & Units 04/08/2021 04/07/2021  Glucose 70 - 99 mg/dL 06/07/2021) 536(R)  BUN 6 - 20 mg/dL 15 18  Creatinine 443(X - 1.24 mg/dL 5.40 0.86  Sodium 7.61 - 145 mmol/L 135 135  Potassium 3.5 - 5.1 mmol/L 4.2 4.5  Chloride 98 - 111 mmol/L 104 103  CO2 22 - 32 mmol/L 25 20(L)  Calcium 8.9 - 10.3 mg/dL 950) 9.3(O  Total Protein 6.5 - 8.1 g/dL - 8.7(H)  Total Bilirubin 0.3 - 1.2 mg/dL - 1.0  Alkaline Phos 38 - 126 U/L - 54  AST 15 - 41 U/L - 33  ALT 0 - 44 U/L - 25    Imaging studies: No new pertinent imaging studies   Assessment/Plan:  35 y.o. male with strangulated internal hernia with closed-loop obstruction 2 Day Post-Op s/p repair of internal hernia and small bowel resection with anastomosis.  Patient  recovering adequately.  She started passing gas yesterday.  Will advance diet to full liquids.  We will continue with IV antibiotic therapy.  The wound is currently dry and clean.  I encouraged the patient to ambulate.  We will continue with pain medications.  20, MD

## 2021-04-10 MED ORDER — HYDROCODONE-ACETAMINOPHEN 5-325 MG PO TABS: 1.0000 | ORAL_TABLET | ORAL | 0 refills | Status: AC | PRN

## 2021-04-10 NOTE — Discharge Summary (Signed)
  Patient ID: Austin Mccullough MRN: [id number] DOB/AGE: October 20, 1985 35 y.o.  Admit date: 04/07/2021 Discharge date: 04/10/2021   Discharge Diagnoses:  Active Problems:   Small bowel ischemia [facility])   Procedures: Exploratory laparotomy with repair of internal hernia and small bowel resection with anastomosis  Hospital Course: Patient admitted with closed-loop small bowel obstruction with strangulated internal hernia.  He underwent emergent exploratory laparotomy.  Small bowel resection was done with anastomosis.  He tolerated the procedure well.  He has been recovering adequately.  He is passing gas.  He is tolerating diet.  He has been advanced to soft diet.  He is passing gas.  He is ambulating.  The pain is controlled.  Wound is dry and clean.  Physical Exam HENT:     Head: Normocephalic.  Cardiovascular:     Rate and Rhythm: Normal rate and regular rhythm.  Pulmonary:     Effort: Pulmonary effort is normal.     Breath sounds: Normal breath sounds.  Abdominal:     General: Abdomen is flat. Bowel sounds are normal.     Palpations: Abdomen is soft.  Neurological:     Mental Status: He is alert and oriented to person, place, and time.  Midline abdominal wound dry and clean.   Consults: None  Disposition: Discharge disposition: 01-Home or Self Care       Discharge Instructions     Diet - low sodium heart healthy   Complete by: As directed    Increase activity slowly   Complete by: As directed       Allergies as of 04/10/2021       Reactions   Morphine And Related         Medication List     TAKE these medications    HYDROcodone-acetaminophen 5-325 MG tablet Commonly known as: Norco Take 1 tablet by mouth every 4 (four) hours as needed for up to 3 days for moderate pain.        Follow-up Information     Carolan Shiver, MD Follow up in 2 week(s).   Specialty: General Surgery Why: Follow up small bowel resection Contact information: [address] [place] [address] [phone]

## 2021-04-10 NOTE — Discharge Instructions (Signed)

## 2021-04-13 LAB — SURGICAL PATHOLOGY

## 2022-09-23 IMAGING — CT CT ABD-PELV W/ CM
2 of 4 series · 15 of 46 positions shown, 17 images · IV contrast (APPLIED)
Comparison: None.

CLINICAL DATA: Abdominal pain with nausea vomiting.

EXAM:
CT ABDOMEN AND PELVIS WITH CONTRAST
TECHNIQUE: Multidetector CT imaging of the abdomen and pelvis was performed
using the standard protocol following bolus administration of
intravenous contrast.
CONTRAST:  100mL OMNIPAQUE IOHEXOL 350 MG/ML SOLN

[Series 2: axial st · axial · 0.76mm/px · z∈[-1058,-608]mm · 12 of 100 slices shown, 14 images]
[im 5/100  soft-tissue]
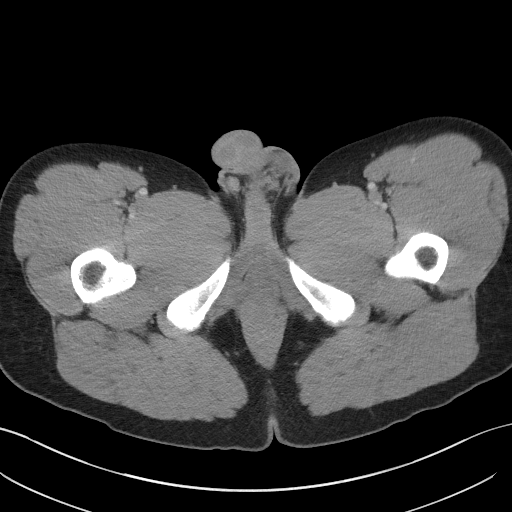
[im 5/100  bone]
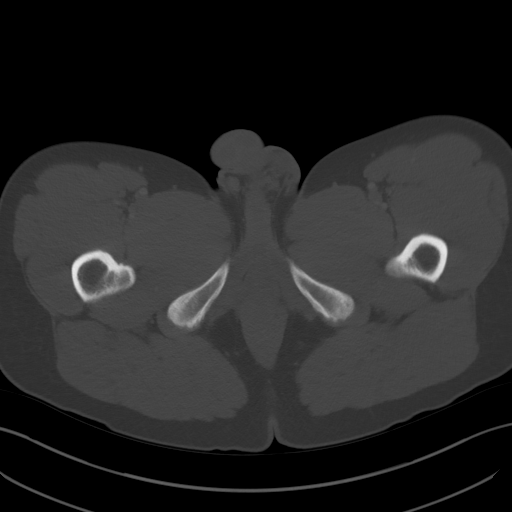
[im 13/100  soft-tissue]
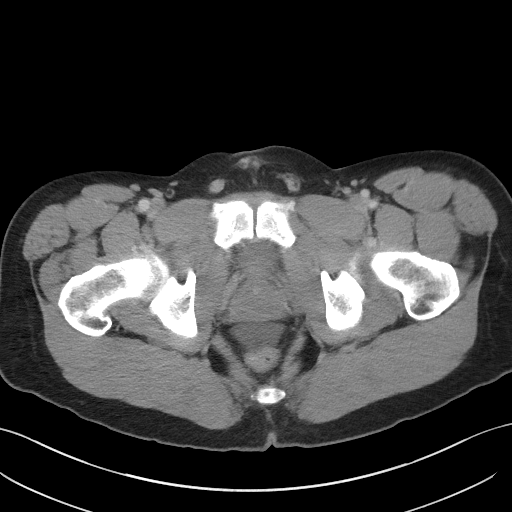
[im 21/100  soft-tissue]
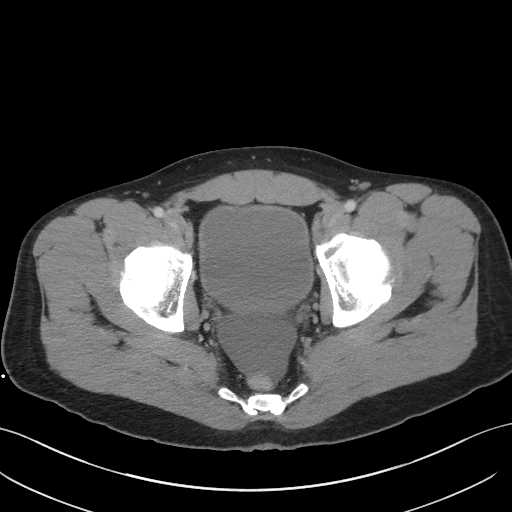
[im 29/100  soft-tissue]
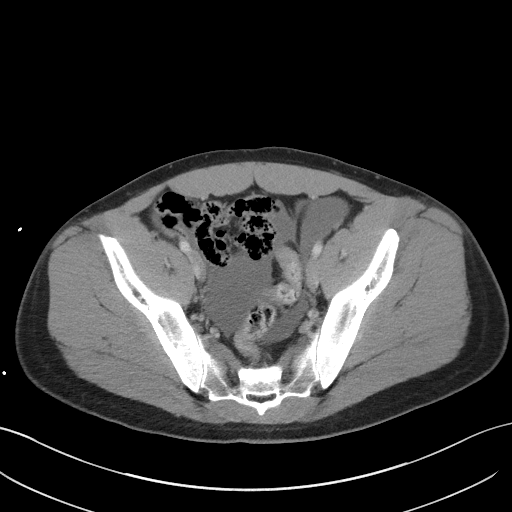
[im 38/100  soft-tissue]
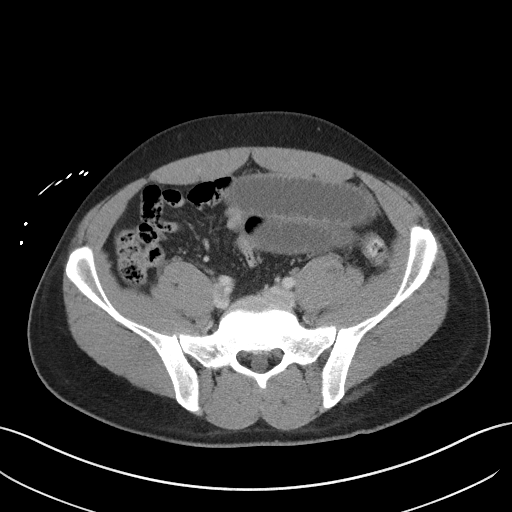
[im 46/100  soft-tissue]
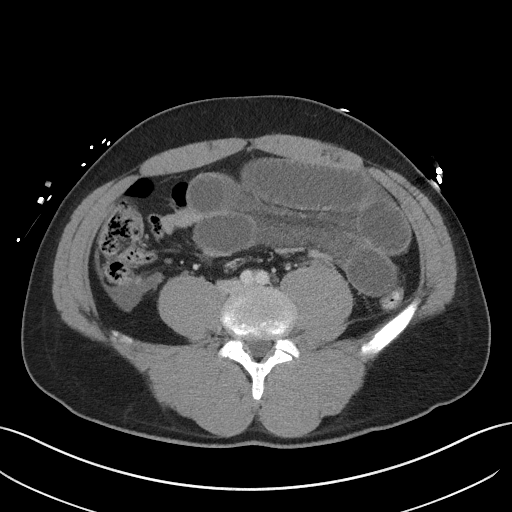
[im 54/100  soft-tissue]
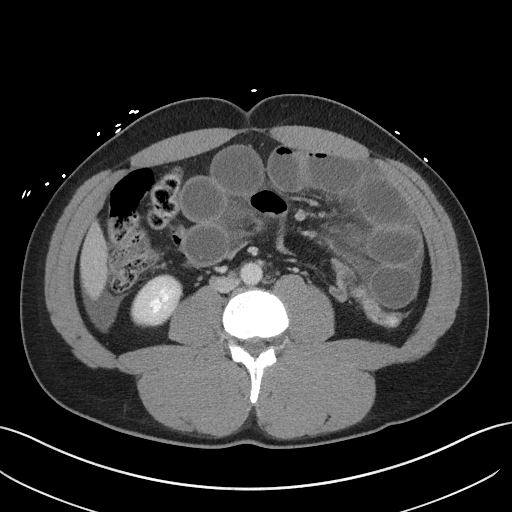
[im 62/100  soft-tissue]
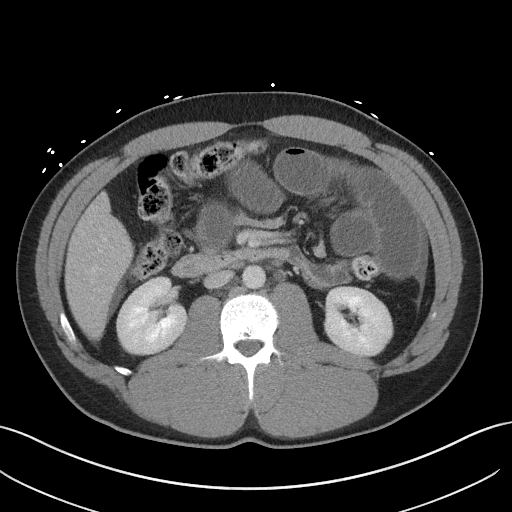
[im 71/100  soft-tissue]
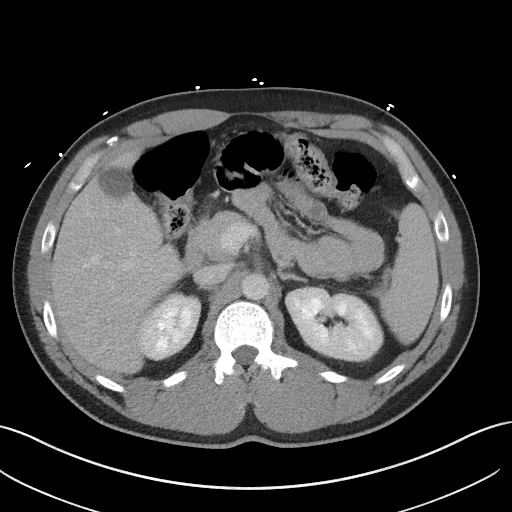
[im 71/100  bone]
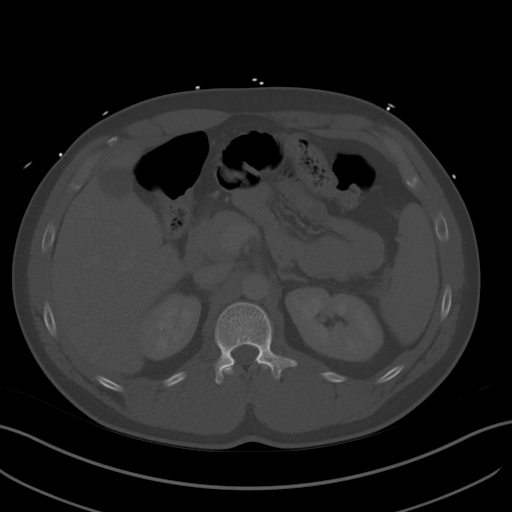
[im 79/100  soft-tissue]
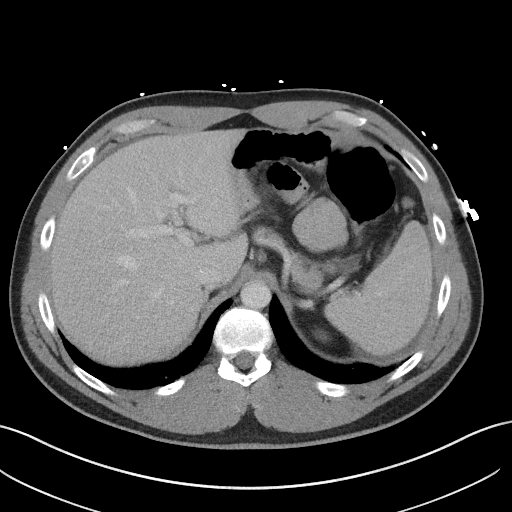
[im 87/100  soft-tissue]
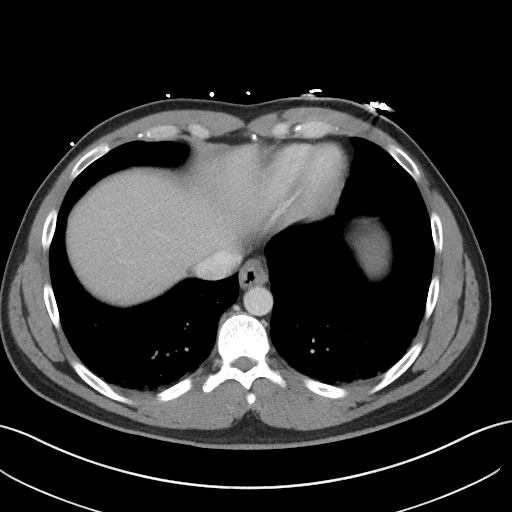
[im 95/100  soft-tissue]
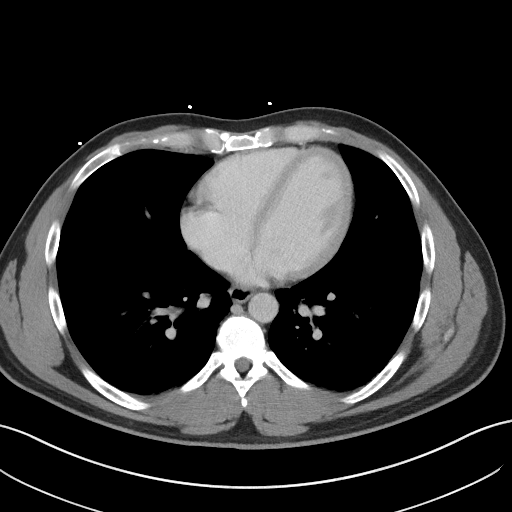

[Series 5: coronal st · coronal · 0.82mm/px · 3 of 94 slices shown]
[im 32/94  soft-tissue]
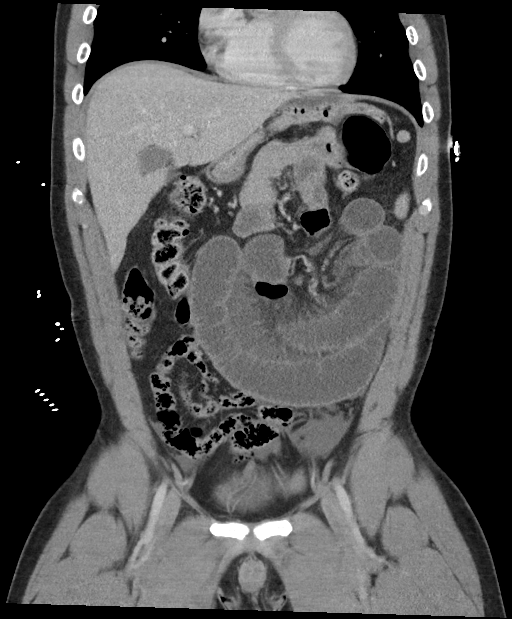
[im 42/94  soft-tissue]
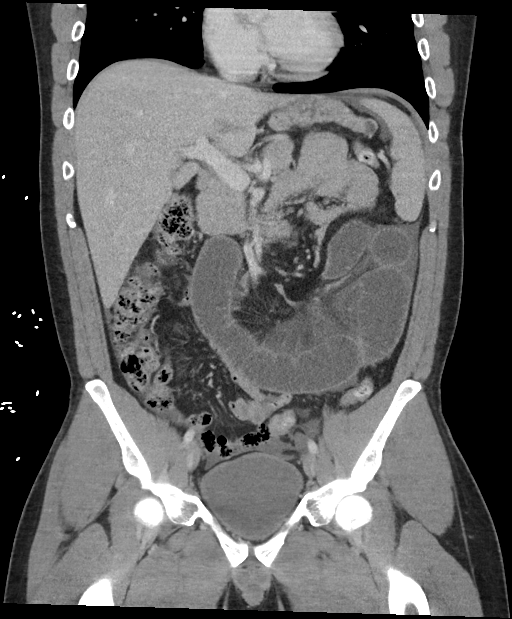
[im 52/94  soft-tissue]
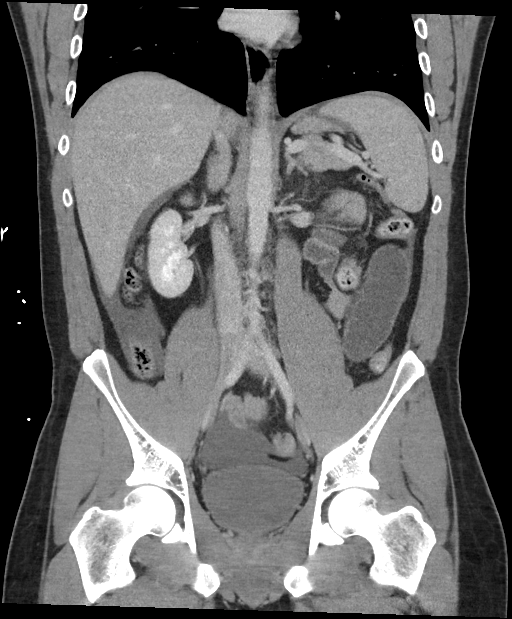

[15 of 46 positions shown; findings below may reference images not displayed]

FINDINGS: Lower chest: See report for CTA Chest performed at the same time.

Hepatobiliary: No suspicious focal abnormality within the liver
parenchyma. There is no evidence for gallstones, gallbladder wall
thickening, or pericholecystic fluid. No intrahepatic or
extrahepatic biliary dilation.

Pancreas: No focal mass lesion. No dilatation of the main duct. No
intraparenchymal cyst. No peripancreatic edema.

Spleen: No splenomegaly. No focal mass lesion.

Adrenals/Urinary Tract: No adrenal nodule or mass. Right kidney
unremarkable. 11 mm low-density lesion lower pole left kidney has
features compatible with a cyst. No evidence for hydroureter. The
urinary bladder appears normal for the degree of distention.

Stomach/Bowel: Stomach is nondistended Duodenum is normally
positioned as is the ligament of Treitz. Jejunal loops are
nondistended. Central small bowel loops of the mid left abdomen are
fluid-filled and dilated up to 3.6 cm diameter. No substantial wall
thickening or evidence of pneumatosis. There is fairly marked edema
and congestion of the subtending mesentery

2 discrete areas of transition are identified, 1 is in the central
left paramidline abdomen visible on axial image 43/2 and coronal
36/5. A second transition zone is identified in close proximity,
just posterior and slightly more to the left, also visible on axial
43/2 and visible on coronal 40/5. Distal and terminal ileal loops
are decompressed. Fecalization of distal small bowel contents
suggest decreased transit. Appearance superficially raises the
question of pneumatosis although this bowel is otherwise normal in
appearance and right colon has similar imaging features making the
finding most compatible with enteric and colonic intraluminal
contents. Stool is visible along the course of the nondistended
colon.

Vascular/Lymphatic: No abdominal aortic aneurysm. No abdominal
lymphadenopathy.

Reproductive: The prostate gland and seminal vesicles are
unremarkable.

Other: Small volume free liver and spleen fluid noted around the
with fluid in both para colic gutters and a moderate amount of fluid
noted in the pelvis.

Musculoskeletal: No worrisome lytic or sclerotic osseous
abnormality.
IMPRESSION: 1. Dilated fluid-filled small bowel loops in the mid left abdomen
with fairly marked edema and congestion of the subtending mesentery
consistent with obstruction. The proximal jejunal loops are
nondilated and the distal ileal loops are nondilated. Two discrete,
adjacent areas of transition are identified. Given the large amount
of edema and fluid in the subtending mesentery, small bowel ischemia
is a concern although no substantial wall thickening is evident and
there are no findings of pneumatosis. The presence of 2 distinct
transition zones suggests closed loop obstruction with potential of
internal hernia as the etiology.
2. Small volume free liver and spleen fluid with fluid in both para
colic gutters and a moderate amount of fluid noted in the pelvis.

I discussed these results by telephone with Dr. Jacota at

## 2022-09-23 IMAGING — CR DG CHEST 2V
1 series · 2 of 2 positions shown · non-contrast
Comparison: None.

CLINICAL DATA: abd pain, eval for infiltrate

EXAM:
CHEST - 2 VIEW

[Series 1: dg chest 2 view · 0.14mm/px · 2 of 2 slices shown]
[im 1/2]
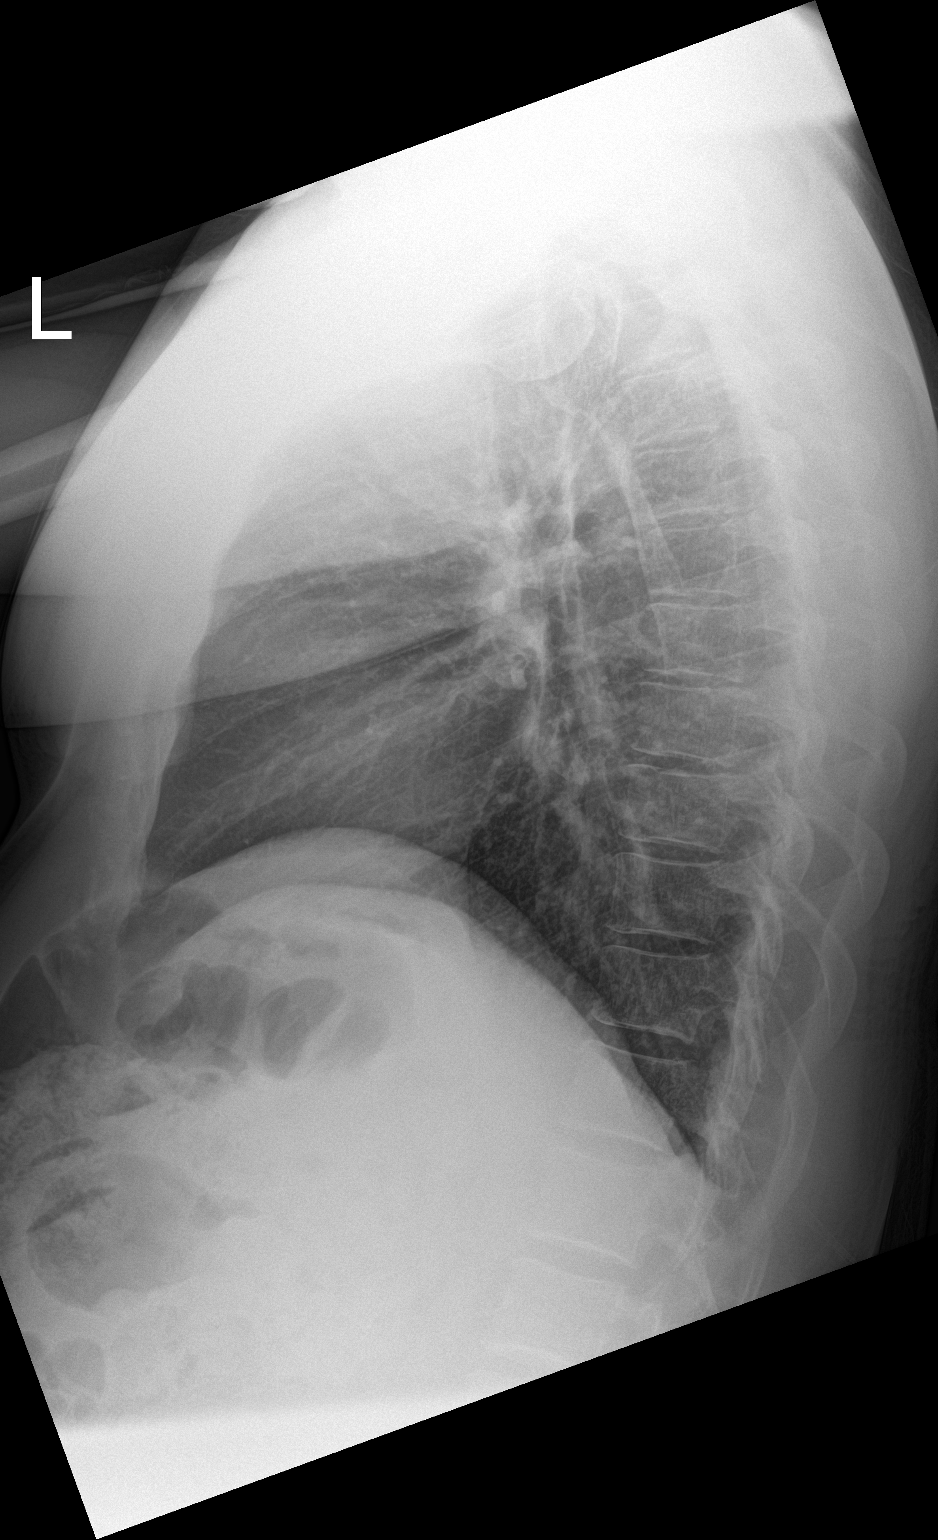
[im 2/2]
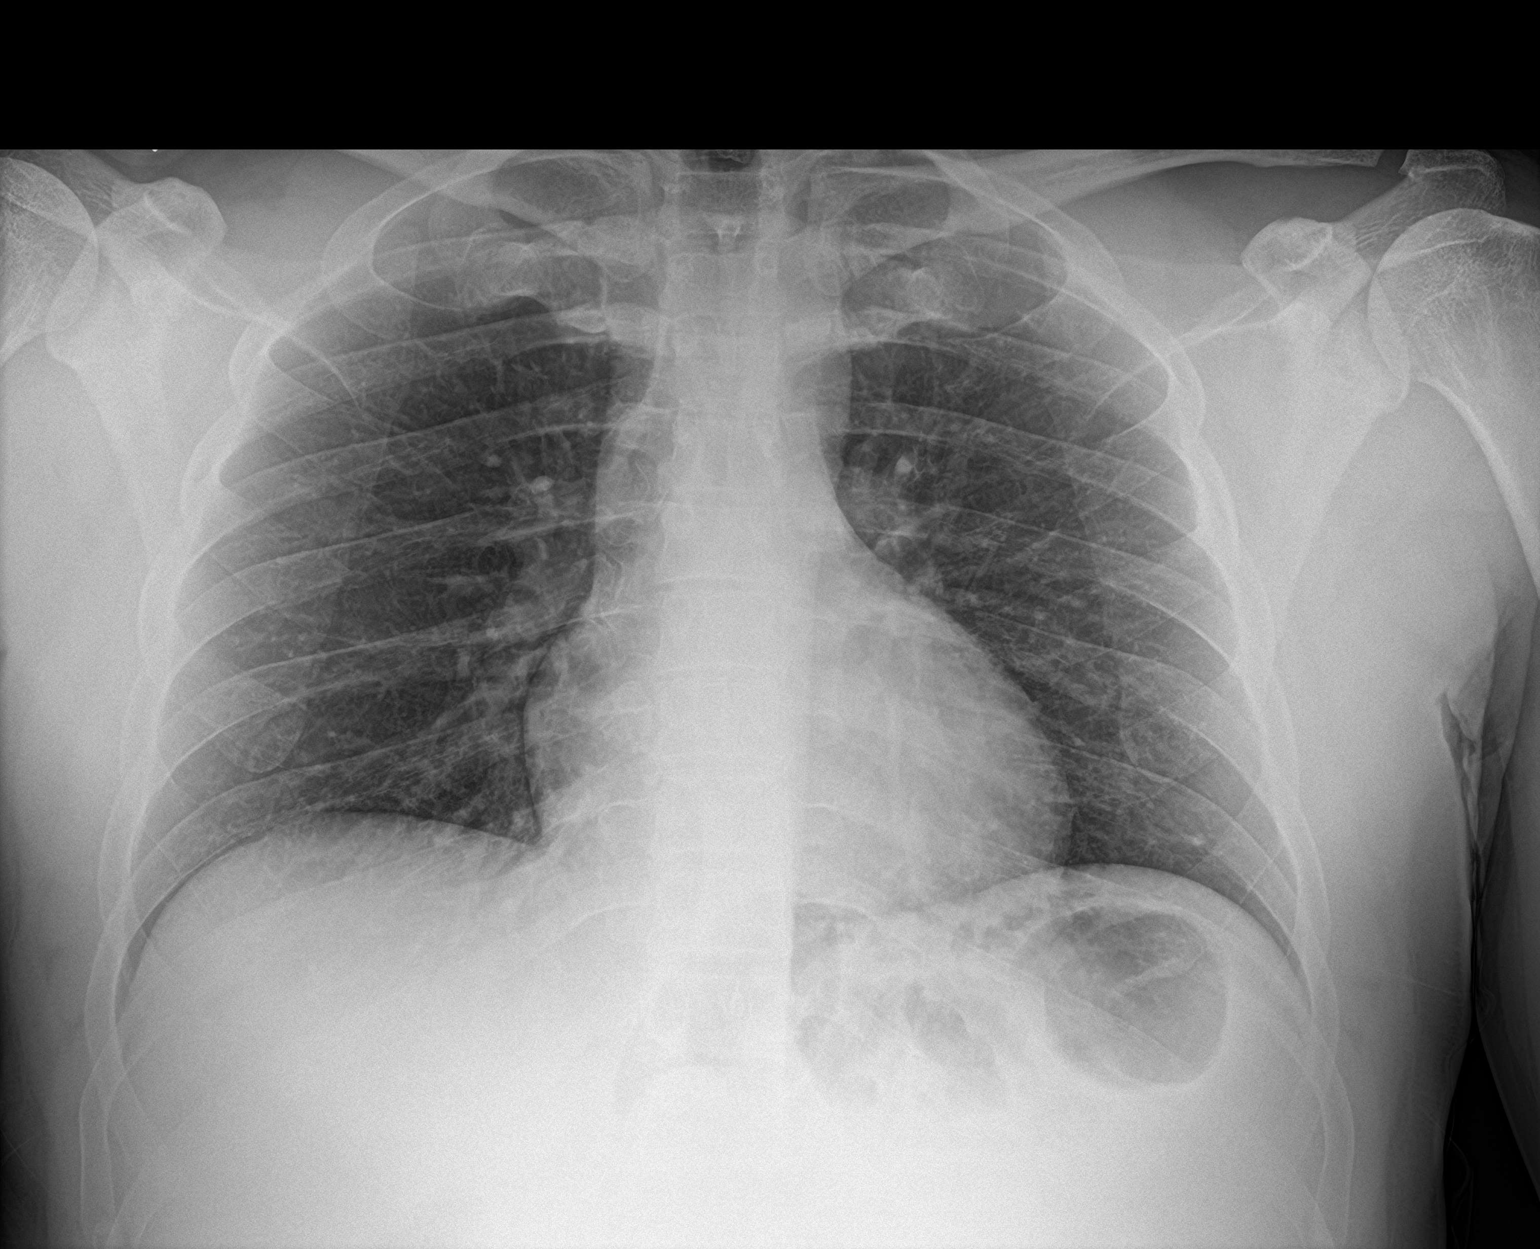

[2 of 2 positions shown; findings below may reference images not displayed]

FINDINGS: The heart size and mediastinal contours are within normal limits.
Both lungs are clear. No visible pleural effusions or pneumothorax.
No acute osseous abnormality.
IMPRESSION: No evidence of acute cardiopulmonary disease.

## 2023-12-31 ENCOUNTER — Ambulatory Visit
Admission: EM | Admit: 2023-12-31 | Discharge: 2023-12-31 | Discharge status: Against Medical Advice | Payer: Self-pay | Attending: Emergency Medicine | Admitting: Emergency Medicine
